# Patient Record
Sex: Female | Born: 2000 | Race: Black or African American | Hispanic: No | Marital: Single | Smoking: Never smoker
Health system: Southern US, Community
[De-identification: ages and names within clinical notes are randomized; demographics above are authoritative.]

## PROBLEM LIST (undated history)

## (undated) DIAGNOSIS — N390 Urinary tract infection, site not specified: Secondary | ICD-10-CM

## (undated) DIAGNOSIS — N83202 Unspecified ovarian cyst, left side: Secondary | ICD-10-CM

## (undated) DIAGNOSIS — N946 Dysmenorrhea, unspecified: Secondary | ICD-10-CM

## (undated) DIAGNOSIS — N920 Excessive and frequent menstruation with regular cycle: Secondary | ICD-10-CM

## (undated) DIAGNOSIS — D649 Anemia, unspecified: Secondary | ICD-10-CM

## (undated) HISTORY — DX: Unspecified ovarian cyst, left side: N83.202

## (undated) HISTORY — DX: Anemia, unspecified: D64.9

## (undated) HISTORY — DX: Urinary tract infection, site not specified: N39.0

## (undated) HISTORY — DX: Dysmenorrhea, unspecified: N94.6

## (undated) HISTORY — DX: Excessive and frequent menstruation with regular cycle: N92.0

---
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
## 2019-04-29 ENCOUNTER — Telehealth: Payer: Self-pay

## 2019-04-29 NOTE — Telephone Encounter (Signed)
This is ok if covid 19 screen negative for both

## 2019-04-29 NOTE — Telephone Encounter (Signed)
Copied from [place] [id number]. Topic: General - Other >> Apr 29, 2019  9:18 AM Oneta Rack wrote: Reason for [id number] scheduled NPA with Dr. Aundra Dubin for 05/29/2019, mother would like to accompany daughter at first visit, please advise

## 2019-04-29 NOTE — Telephone Encounter (Signed)
[place] if mother comes in?

## 2019-04-29 NOTE — Telephone Encounter (Signed)
Patients mother has been notified.

## 2019-05-29 ENCOUNTER — Encounter: Payer: Self-pay | Admitting: Internal Medicine

## 2019-05-29 ENCOUNTER — Ambulatory Visit (INDEPENDENT_AMBULATORY_CARE_PROVIDER_SITE_OTHER): Payer: PPO | Admitting: Internal Medicine

## 2019-05-29 ENCOUNTER — Encounter

## 2019-05-29 VITALS — Ht 64.0 in | Wt 135.0 lb

## 2019-05-29 DIAGNOSIS — D649 Anemia, unspecified: Secondary | ICD-10-CM | POA: Diagnosis not present

## 2019-05-29 DIAGNOSIS — Z0000 Encounter for general adult medical examination without abnormal findings: Secondary | ICD-10-CM

## 2019-05-29 DIAGNOSIS — E559 Vitamin D deficiency, unspecified: Secondary | ICD-10-CM

## 2019-05-29 DIAGNOSIS — N924 Excessive bleeding in the premenopausal period: Secondary | ICD-10-CM | POA: Diagnosis not present

## 2019-05-29 DIAGNOSIS — N946 Dysmenorrhea, unspecified: Secondary | ICD-10-CM

## 2019-05-29 DIAGNOSIS — N921 Excessive and frequent menstruation with irregular cycle: Secondary | ICD-10-CM | POA: Insufficient documentation

## 2019-05-29 DIAGNOSIS — Z1159 Encounter for screening for other viral diseases: Secondary | ICD-10-CM

## 2019-05-29 DIAGNOSIS — Z1389 Encounter for screening for other disorder: Secondary | ICD-10-CM

## 2019-05-29 DIAGNOSIS — Z1329 Encounter for screening for other suspected endocrine disorder: Secondary | ICD-10-CM

## 2019-05-29 DIAGNOSIS — Z0184 Encounter for antibody response examination: Secondary | ICD-10-CM

## 2019-05-29 NOTE — Progress Notes (Signed)
Virtual Visit via Video Note  I connected with Shirley Green  on 05/29/19 at  3:00 PM EST by a video enabled telemedicine application and verified that I am speaking with the correct person using two identifiers.  Location patient: home Location provider:work or home office Persons participating in the virtual visit: patient, provider, pts mom Shirley Green  I discussed the limitations of evaluation and management by telemedicine and the availability of in person appointments. The patient expressed understanding and agreed to proceed.   HPI: 1. C/o heavy cycles though regular with severe cramps, chronically was given ocp ortho tri cyclen lo vs tri lo marzia 11/2018 by [place] and tried progesterone cream w/o relief. Cycles start heavy stop and start with light to heavy to light flow. Tried Ibuprofen/Advil/Aleve w/o relief and a heating pad mom sees Fraser Din at [facility] will refer there    ROS: See pertinent positives and negatives per HPI. General: wt stable HEENT: no sore throat  CV: no chest pain  Lungs: no sob GI: ab pain with menses with cramping  GU: severe heavy menses  MSK: no jt pain  Skin: no issues  Neuro: no h/a  Psych: no mood issues  Past Medical History:  Diagnosis Date  . Anemia   . Dysmenorrhea   . Heavy menses     Past Surgical History:  Procedure Laterality Date  . TONSILLECTOMY Bilateral 2007    Family History  Problem Relation Age of Onset  . Heart Problems Mother        born TOF with pulmonary valve replaced 09/2016   . Thyroid cancer Mother   . Other Mother        breast implants 07/2001;small bowel obstruction; DDD C5/6 psinal fusion; C3/6/7 DDD; breast mass with right lumpectomy 04/2016  . Neuropathy Mother        pain clinic on topamax   . Migraines Mother   . Cholecystitis Mother        GB removed  . Hernia Mother        hernia repair Dr. Bary Castilla   . Colitis Mother        [facility] Dr. Candace Cruise  . Rheum arthritis Mother        ?  Berenice Primas'  disease Mother        RAI x 2 04/2010& 01/2011 dx Dr. Cruzita Lederer  . Allergies Brother   . Hyperlipidemia Maternal Grandmother   . Hypertension Maternal Grandmother   . Hyperlipidemia Maternal Grandfather   . Hypertension Maternal Grandfather   . Hyperlipidemia Paternal Grandmother   . Hypertension Paternal Grandmother   . Hyperlipidemia Paternal Grandfather   . Hypertension Paternal Grandfather   . Heart disease Paternal Grandfather   . Prostate cancer Other        m GF  . Lung cancer Other        p GGF  . Diabetes Other        pGGM  . Cancer Other        mGGF  . Stroke Other        mGGF  . Pancreatic cancer Other        mGGM     SOCIAL HX:  DPR Shaylene Paganelli [phone] mom Likes to travel has been on cruises, been to [place], [place], [place]. 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  TRI-LO-MARZIA 0.18/0.215/0.25  MG-25 MCG tab, Take 1 tablet by mouth daily., Disp: , Rfl:   EXAM:  VITALS per patient if applicable:  GENERAL: alert, oriented, appears well and in no acute distress  HEENT: atraumatic, conjunttiva clear, no obvious abnormalities on inspection of external nose and ears  NECK: normal movements of the head and neck  LUNGS: on inspection no signs of respiratory distress, breathing rate appears normal, no obvious gross SOB, gasping or wheezing  CV: no obvious cyanosis  MS: moves all visible extremities without noticeable abnormality  PSYCH/NEURO: pleasant and cooperative, no obvious depression or anxiety, speech and thought processing grossly intact  ASSESSMENT AND PLAN:  Discussed the following assessment and plan:  Dysmenorrhea - Plan: obgyn  Menorrhagia, premenopausal - Plan: obgyn This needs to be managed by ob/gyn as I do not manage this and ocp or other options need to be discussed with ob/gyn Fraser Din   Anemia,  unspecified type - Plan: CBC with Differential/Platelet, Iron, TIBC and Ferritin Panel She is supposed to be on ferrous sulfate by peds 325 with breakfast hbg 10.3 in 11/2018   HM Flu shot pt wants to get with labs nonfasting  Tdap due 01/2022  See vaccines in immunizations will pull over  Check MMR and hep B status Negative TB 11/2018 rec healthy diet and exercise and resp. Choices.   -we discussed possible serious and likely etiologies, options for evaluation and workup, limitations of telemedicine visit vs in person visit, treatment, treatment risks and precautions. Pt prefers to treat via telemedicine empirically rather then risking or undertaking an in person visit at this moment. Patient agrees to seek prompt in person care if worsening, new symptoms arise, or if is not improving with treatment.   I discussed the assessment and treatment plan with the patient. The patient was provided an opportunity to ask questions and all were answered. The patient agreed with the plan and demonstrated an understanding of the instructions.   The patient was advised to call back or seek an in-person evaluation if the symptoms worsen or if the condition fails to improve as anticipated.  Time spent 30 minutes  Delorise Jackson, MD

## 2019-06-02 ENCOUNTER — Encounter: Payer: Self-pay | Admitting: Internal Medicine

## 2019-06-02 NOTE — Patient Instructions (Signed)
Menorrhagia ° °Menorrhagia is a condition in which menstrual periods are heavy or last longer than normal. With menorrhagia, most periods a woman has may cause enough blood loss and cramping that she becomes unable to take part in her usual activities. °What are the causes? °Common causes of this condition include: °· Noncancerous growths in the uterus (polyps or fibroids). °· An imbalance of the estrogen and progesterone hormones. °· One of the ovaries not releasing an egg during one or more months. °· A problem with the thyroid gland (hypothyroid). °· Side effects of having an intrauterine device (IUD). °· Side effects of some medicines, such as anti-inflammatory medicines or blood thinners. °· A bleeding disorder that stops the blood from clotting normally. °In some cases, the cause of this condition is not known. °What are the signs or symptoms? °Symptoms of this condition include: °· Routinely having to change your pad or tampon every 1-2 hours because it is completely soaked. °· Needing to use pads and tampons at the same time because of heavy bleeding. °· Needing to wake up to change your pads or tampons during the night. °· Passing blood clots larger than 1 inch (2.5 cm) in size. °· Having bleeding that lasts for more than 7 days. °· Having symptoms of low iron levels (anemia), such as tiredness, fatigue, or shortness of breath. °How is this diagnosed? °This condition may be diagnosed based on: °· A physical exam. °· Your symptoms and menstrual history. °· Tests, such as: °? Blood tests to check if you are pregnant or have hormonal changes, a bleeding or thyroid disorder, anemia, or other problems. °? Pap test to check for cancerous changes, infections, or inflammation. °? Endometrial biopsy. This test involves removing a tissue sample from the lining of the uterus (endometrium) to be examined under a microscope. °? Pelvic ultrasound. This test uses sound waves to create images of your uterus, ovaries, and  vagina. The images can show if you have fibroids or other growths. °? Hysteroscopy. For this test, a small telescope is used to look inside your uterus. °How is this treated? °Treatment may not be needed for this condition. If it is needed, the best treatment for you will depend on: °· Whether you need to prevent pregnancy. °· Your desire to have children in the future. °· The cause and severity of your bleeding. °· Your personal preference. °Medicines are the first step in treatment. You may be treated with: °· Hormonal birth control methods. These treatments reduce bleeding during your menstrual period. They include: °? Birth control pills. °? Skin patch. °? Vaginal ring. °? Shots (injections) that you get every 3 months. °? Hormonal IUD (intrauterine device). °? Implants that go under the skin. °· Medicines that thicken blood and slow bleeding. °· Medicines that reduce swelling, such as ibuprofen. °· Medicines that contain an artificial (synthetic) hormone called progestin. °· Medicines that make the ovaries stop working for a short time. °· Iron supplements to treat anemia. °If medicines do not work, surgery may be done. Surgical options may include: °· Dilation and curettage (D&C). In this procedure, your health care provider opens (dilates) your cervix and then scrapes or suctions tissue from the endometrium to reduce menstrual bleeding. °· Operative hysteroscopy. In this procedure, a small tube with a light on the end (hysteroscope) is used to view your uterus and help remove polyps that may be causing heavy periods. °· Endometrial ablation. This is when various techniques are used to permanently destroy your entire endometrium.   After endometrial ablation, most women have little or no menstrual flow. This procedure reduces your ability to become pregnant.  Endometrial resection. In this procedure, an electrosurgical wire loop is used to remove the endometrium. This procedure reduces your ability to become  pregnant.  Hysterectomy. This is surgical removal of the uterus. This is a permanent procedure that stops menstrual periods. Pregnancy is not possible after a hysterectomy. Follow these instructions at home: Medicines  Take over-the-counter and prescription medicines exactly as told by your health care provider. This includes iron pills.  Do not change or switch medicines without asking your health care provider.  Do not take aspirin or medicines that contain aspirin 1 week before or during your menstrual period. Aspirin may make bleeding worse. General instructions  If you need to change your sanitary pad or tampon more than once every 2 hours, limit your activity until the bleeding stops.  Iron pills can cause constipation. To prevent or treat constipation while you are taking prescription iron supplements, your health care provider may recommend that you: ? Drink enough fluid to keep your urine clear or pale yellow. ? Take over-the-counter or prescription medicines. ? Eat foods that are high in fiber, such as fresh fruits and vegetables, whole grains, and beans. ? Limit foods that are high in fat and processed sugars, such as fried and sweet foods.  Eat well-balanced meals, including foods that are high in iron. Foods that have a lot of iron include leafy green vegetables, meat, liver, eggs, and whole grain breads and cereals.  Do not try to lose weight until the abnormal bleeding has stopped and your blood iron level is back to normal. If you need to lose weight, work with your health care provider to lose weight safely.  Keep all follow-up visits as told by your health care provider. This is important. Contact a health care provider if:  You soak through a pad or tampon every 1 or 2 hours, and this happens every time you have a period.  You need to use pads and tampons at the same time because you are bleeding so much.  You have nausea, vomiting, diarrhea, or other problems  related to medicines you are taking. Get help right away if:  You soak through more than a pad or tampon in 1 hour.  You pass clots bigger than 1 inch (2.5 cm) wide.  You feel short of breath.  You feel like your heart is beating too fast.  You feel dizzy or faint.  You feel very weak or tired. Summary  Menorrhagia is a condition in which menstrual periods are heavy or last longer than normal.  Treatment will depend on the cause of the condition and may include medicines or procedures.  Take over-the-counter and prescription medicines exactly as told by your health care provider. This includes iron pills.  Get help right away if you have heavy bleeding that soaks through more than a pad or tampon in 1 hour, you are passing large clots, or you feel dizzy, faint or short of breath. This information is not intended to replace advice given to you by your health care provider. Make sure you discuss any questions you have with your health care provider. Document Released: 06/12/2005 Document Revised: 09/19/2017 Document Reviewed: 06/05/2016 Elsevier Patient Education  2020 [employer]  Dysmenorrhea  Dysmenorrhea refers to cramps caused by the muscles of the uterus tightening (contracting) during a menstrual period. Dysmenorrhea may be mild, or it may be severe enough  to interfere with everyday activities for a few days each month. Primary dysmenorrhea is menstrual cramps that last a couple of days when you start having menstrual periods or soon after. This often begins after a teenager starts having her period. As a woman gets older or has a baby, the cramps will usually lessen or disappear. Secondary dysmenorrhea begins later in life and is caused by a disorder in the reproductive system. It lasts longer, and it may cause more pain than primary dysmenorrhea. The pain may start before the period and last a few days after the period. What are the causes? Dysmenorrhea is usually caused by  an underlying problem, such as:  The tissue that lines the uterus (endometrium) growing outside of the uterus in other areas of the body (endometriosis).  Endometrial tissue growing into the muscular walls of the uterus (adenomyosis).  Blood vessels in the pelvis becoming filled with blood just before the menstrual period (pelvic congestive syndrome).  Overgrowth of cells (polyps) in the endometrium or the lower part of the uterus (cervix).  The uterus dropping down into the vagina (prolapse) due to stretched or weak muscles.  Bladder problems, such as infection or inflammation.  Intestinal problems, such as a tumor or irritable bowel syndrome.  Cancer of the reproductive organs or bladder.  A severely tipped uterus.  A cervix that is closed or has a very small opening.  Noncancerous (benign) tumors of the uterus (fibroids).  Pelvic inflammatory disease (PID).  Pelvic scarring (adhesions) from a previous surgery.  An ovarian cyst.  An IUD (intrauterine device). What increases the risk? You are more likely to develop this condition if:  You are younger than age 19.  You started puberty early.  You have irregular or heavy bleeding.  You have never given birth.  You have a family history of dysmenorrhea.  You smoke. What are the signs or symptoms? Symptoms of this condition include:  Cramping, throbbing pain, or a feeling of fullness in the lower abdomen.  Lower back pain.  Periods lasting for longer than 7 days.  Headaches.  Bloating.  Fatigue.  Nausea or vomiting.  Diarrhea.  Sweating or dizziness.  Loose stools. How is this diagnosed? This condition may be diagnosed based on:  Your symptoms.  Your medical history.  A physical exam.  Blood tests.  A Pap test. This is a test in which cells from the cervix are tested for signs of cancer or infection.  A pregnancy test.  Imaging tests, such as: ? Ultrasound. ? A procedure to remove and  examine a sample of endometrial tissue (dilation and curettage, D&C). ? A procedure to visually examine the inside of:  The uterus (hysteroscopy).  The abdomen or pelvis (laparoscopy).  The bladder (cystoscopy).  The intestine (colonoscopy).  The stomach (gastroscopy). ? X-rays. ? CT scan. ? MRI. How is this treated? Treatment depends on the cause of the dysmenorrhea. Treatment may include:  Pain medicine prescribed by your health care provider.  Birth control pills that contain the hormone progesterone.  An IUD that contains the hormone progesterone.  Medicines to control bleeding.  Hormone replacement therapy.  NSAIDs. These may help to stop the production of hormones that cause cramps.  Antidepressant medicines.  Surgery to remove adhesions, endometriosis, ovarian cysts, fibroids, or the entire uterus (hysterectomy).  Injections of progesterone to stop the menstrual period.  A procedure to destroy the endometrium (endometrial ablation).  A procedure to cut the nerves in the bottom of the spine (sacrum)  that go to the reproductive organs (presacral neurectomy).  A procedure to apply an electric current to nerves in the sacrum (sacral nerve stimulation).  Exercise and physical therapy.  Meditation and yoga therapy.  Acupuncture. Work with your health care provider to determine what treatment or combination of treatments is best for you. Follow these instructions at home: Relieving pain and cramping  Apply heat to your lower back or abdomen when you experience pain or cramps. Use the heat source that your health care provider recommends, such as a moist heat pack or a heating pad. ? Place a towel between your skin and the heat source. ? Leave the heat on for 20-30 minutes. ? Remove the heat if your skin turns bright red. This is especially important if you are unable to feel pain, heat, or cold. You may have a greater risk of getting burned. ? Do not sleep with  a heating pad on.  Do aerobic exercises, such as walking, swimming, or biking. This can help to relieve cramps.  Massage your lower back or abdomen to help relieve pain. General instructions  Take over-the-counter and prescription medicines only as told by your health care provider.  Do not drive or use heavy machinery while taking prescription pain medicine.  Avoid alcohol and caffeine during and right before your menstrual period. These can make cramps worse.  Do not use any products that contain nicotine or tobacco, such as cigarettes and e-cigarettes. If you need help quitting, ask your health care provider.  Keep all follow-up visits as told by your health care provider. This is important. Contact a health care provider if:  You have pain that gets worse or does not get better with medicine.  You have pain with sex.  You develop nausea or vomiting with your period that is not controlled with medicine. Get help right away if:  You faint. Summary  Dysmenorrhea refers to cramps caused by the muscles of the uterus tightening (contracting) during a menstrual period.  Dysmenorrhea may be mild, or it may be severe enough to interfere with everyday activities for a few days each month.  Treatment depends on the cause of the dysmenorrhea.  Work with your health care provider to determine what treatment or combination of treatments is best for you. This information is not intended to replace advice given to you by your health care provider. Make sure you discuss any questions you have with your health care provider. Document Released: 06/12/2005 Document Revised: 05/25/2017 Document Reviewed: 07/15/2016 Elsevier Patient Education  2020 [employer].

## 2019-06-02 NOTE — Progress Notes (Signed)
I have abstracted immunizations & mailed AVS.

## 2019-06-06 ENCOUNTER — Other Ambulatory Visit: Payer: Self-pay

## 2019-06-10 ENCOUNTER — Ambulatory Visit (INDEPENDENT_AMBULATORY_CARE_PROVIDER_SITE_OTHER): Payer: PPO | Admitting: Obstetrics and Gynecology

## 2019-06-10 ENCOUNTER — Other Ambulatory Visit: Payer: Self-pay

## 2019-06-10 ENCOUNTER — Encounter: Payer: Self-pay | Admitting: Obstetrics and Gynecology

## 2019-06-10 VITALS — BP 110/80 | Ht 64.0 in | Wt 132.0 lb

## 2019-06-10 DIAGNOSIS — N921 Excessive and frequent menstruation with irregular cycle: Secondary | ICD-10-CM | POA: Diagnosis not present

## 2019-06-10 DIAGNOSIS — D649 Anemia, unspecified: Secondary | ICD-10-CM | POA: Diagnosis not present

## 2019-06-10 DIAGNOSIS — Z3041 Encounter for surveillance of contraceptive pills: Secondary | ICD-10-CM

## 2019-06-10 MED ORDER — MICROGESTIN 24 FE 1-20 MG-MCG PO TABS: 1.0000 | ORAL_TABLET | Freq: Every day | ORAL | 3 refills | Status: DC

## 2019-06-10 NOTE — Progress Notes (Signed)
McLean-Scocuzza, Pasty Spillers, MD   Chief Complaint  Patient presents with  . Dysmenorrhea    stomach pain with periods since started having cycles 2015, regular flow currently OCP's    HPI:      Ms. Shirley Green is a 18 y.o. No obstetric history on file. who LMP was Patient's last menstrual period was 05/21/2019 (exact date)., presents today for NP eval of dysmen, referred by PCP. Menarche age 65. Menses Q6-8 wks, lasting 6-7 days, mod flow, with clots, no BTB. Had dysmen, not improved with NSAIDs, tylenol. Would sometimes miss school/activities due to pain. Started on OTC Lo 6/20 with pediatrician. Having monthly menses, lasting 5 days, mod flow (no real change), no BTB, and still with severe dsymen. NSAIDs still don't help sx. Has also been having non-migraine headaches since starting OCPs. No hx of HTN, DVTs, migraines with aura. Currently being treated for anemia as well. Was taking Fe supp but stopped. Has labs tomorrow. She has never been sex active.  No FH endometriosis.   Patient Active Problem List   Diagnosis Date Noted  . Menometrorrhagia 05/29/2019  . Dysmenorrhea 05/29/2019  . Anemia 05/29/2019    Past Surgical History:  Procedure Laterality Date  . TONSILLECTOMY Bilateral 2007    Family History  Problem Relation Age of Onset  . Heart Problems Mother        born TOF with pulmonary valve replaced 09/2016   . Thyroid cancer Mother   . Other Mother        breast implants 07/2001;small bowel obstruction; DDD C5/6 psinal fusion; C3/6/7 DDD; breast mass with right lumpectomy 04/2016  . Neuropathy Mother        pain clinic on topamax   . Migraines Mother   . Cholecystitis Mother        GB removed  . Hernia Mother        hernia repair Dr. Lemar Livings   . Colitis Mother        [facility] GI Dr. Bluford Kaufmann  . Rheum arthritis Mother        ?  Luiz Blare' disease Mother        RAI x 2 04/2010& 01/2011 dx Dr. Elvera Lennox  . Allergies Brother   . Hyperlipidemia Maternal Grandmother   .  Hypertension Maternal Grandmother   . Hyperlipidemia Maternal Grandfather   . Hypertension Maternal Grandfather   . Hyperlipidemia Paternal Grandmother   . Hypertension Paternal Grandmother   . Hyperlipidemia Paternal Grandfather   . Hypertension Paternal Grandfather   . Heart disease Paternal Grandfather   . Prostate cancer Other        m GF  . Lung cancer Other        p GGF  . Diabetes Other        pGGM  . Cancer Other        mGGF  . Stroke Other        mGGF  . Pancreatic cancer Other        mGGM     Social History   Socioeconomic History  . Marital status: Single    Spouse name: Not on file  . Number of children: Not on file  . Years of education: Not on file  . Highest education level: Not on file  Occupational History  . Not on file  Tobacco Use  . Smoking status: Never Smoker  . Smokeless tobacco: Never Used  Substance and Sexual Activity  . Alcohol use: Never  . Drug  use: Never  . Sexual activity: Never    Birth control/protection: Pill    Comment: oral sex only   Other Topics Concern  . Not on file  Social History Narrative   DPR Lavonia Danaorriah Murnane [phone] mom   Likes to travel has been on cruises, been to [place]    [employer] 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 Difficulty of Paying Living Expenses: Not on file  Food Insecurity:   . Worried About Programme researcher, broadcasting/film/videounning Out of Food in the Last Year: Not on file  . Ran Out of Food in the Last Year: Not on file  Transportation Needs:   . Lack of Transportation (Medical): Not on file  . Lack of Transportation (Non-Medical): Not on file  Physical Activity:   . Days of Exercise per Week: Not on file  . Minutes of Exercise per Session: Not on file  Stress:   . Feeling of Stress : Not on file  Social Connections:   . Frequency of Communication with Friends  and Family: Not on file  . Frequency of Social Gatherings with Friends and Family: Not on file  . Attends Religious Services: Not on file  . Active Member of Clubs or Organizations: Not on file  . Attends BankerClub or Organization Meetings: Not on file  . Marital Status: Not on file  Intimate Partner Violence:   . Fear of Current or Ex-Partner: Not on file  . Emotionally Abused: Not on file  . Physically Abused: Not on file  . Sexually Abused: Not on file    Outpatient Medications Prior to Visit  Medication Sig Dispense Refill  . TRI-LO-MARZIA 0.18/0.215/0.25 MG-25 MCG tab Take 1 tablet by mouth daily.     No facility-administered medications prior to visit.      ROS:  Review of Systems  Constitutional: Negative for fatigue, fever and unexpected weight change.  Respiratory: Negative for cough, shortness of breath and wheezing.   Cardiovascular: Negative for chest pain, palpitations and leg swelling.  Gastrointestinal: Negative for blood in stool, constipation, diarrhea, nausea and vomiting.  Endocrine: Negative for cold intolerance, heat intolerance and polyuria.  Genitourinary: Positive for menstrual problem. Negative for dyspareunia, dysuria, flank pain, frequency, genital sores, hematuria, pelvic pain, urgency, vaginal bleeding, vaginal discharge and vaginal pain.  Musculoskeletal: Negative for back pain, joint swelling and myalgias.  Skin: Negative for rash.  Neurological: Positive for headaches. Negative for dizziness, syncope, light-headedness and numbness.  Hematological: Negative for adenopathy.  Psychiatric/Behavioral: Negative for agitation, confusion, sleep disturbance and suicidal ideas. The patient is not nervous/anxious.    BREAST: No symptoms   OBJECTIVE:   Vitals:  BP 110/80   Ht 5\' 4"  (1.626 m)   Wt 132 lb (59.9 kg)   LMP 05/21/2019 (Exact Date)   BMI 22.66 kg/m   Physical Exam Vitals reviewed.  Constitutional:      Appearance: She is well-developed.   Pulmonary:     Effort: Pulmonary effort is normal.  Musculoskeletal:        General: Normal range of motion.     Cervical back: Normal range of motion.  Skin:    General: Skin is warm and dry.  Neurological:     General: No focal deficit present.     Mental Status: She is alert and oriented to person, place, and  time.     Cranial Nerves: No cranial nerve deficit.  Psychiatric:        Mood and Affect: Mood normal.        Behavior: Behavior normal.        Thought Content: Thought content normal.        Judgment: Judgment normal.      Assessment/Plan: Menometrorrhagia - Plan: Norethindrone Acetate-Ethinyl Estrad-FE (MICROGESTIN 24 FE) 1-20 MG-MCG(24) tablet; Discussed changing OCPs, depo, nexplanon. Pt prefers to stay on pills. Change to L[place]. Rx eRxd. F/u if sx persist. May need u/s if no sx change.   Encounter for surveillance of contraceptive pills - Plan: Norethindrone Acetate-Ethinyl Estrad-FE (MICROGESTIN 24 FE) 1-20 MG-MCG(24) tablet  Anemia--has lab f/u tomorrow.    Meds ordered this encounter  Medications  . Norethindrone Acetate-Ethinyl Estrad-FE (MICROGESTIN 24 FE) 1-20 MG-MCG(24) tablet    Sig: Take 1 tablet by mouth daily.    Dispense:  84 tablet    Refill:  3    Order Specific Question:   Supervising Provider    Answer:   Gae Dry [[id number]]     Return if symptoms worsen or fail to improve.  Tylee Newby B. Ramiel Forti, PA-C 06/10/2019 4:04 PM

## 2019-06-10 NOTE — Patient Instructions (Signed)
I value your feedback and entrusting us with your care. If you get a [facility] patient survey, I would appreciate you taking the time to let us know about your experience today. Thank you!  As of June 05, 2019, your lab results will be released to your MyChart immediately, before I even have a chance to see them. Please give me time to review them and contact you if there are any abnormalities. Thank you for your patience.  

## 2019-06-11 ENCOUNTER — Other Ambulatory Visit: Payer: Self-pay | Admitting: Internal Medicine

## 2019-06-11 ENCOUNTER — Other Ambulatory Visit: Payer: Self-pay

## 2019-06-11 ENCOUNTER — Encounter: Payer: Self-pay | Admitting: Internal Medicine

## 2019-06-11 ENCOUNTER — Ambulatory Visit (INDEPENDENT_AMBULATORY_CARE_PROVIDER_SITE_OTHER): Payer: PPO

## 2019-06-11 ENCOUNTER — Other Ambulatory Visit (INDEPENDENT_AMBULATORY_CARE_PROVIDER_SITE_OTHER): Payer: PPO

## 2019-06-11 DIAGNOSIS — Z1389 Encounter for screening for other disorder: Secondary | ICD-10-CM

## 2019-06-11 DIAGNOSIS — D649 Anemia, unspecified: Secondary | ICD-10-CM

## 2019-06-11 DIAGNOSIS — E559 Vitamin D deficiency, unspecified: Secondary | ICD-10-CM

## 2019-06-11 DIAGNOSIS — Z1159 Encounter for screening for other viral diseases: Secondary | ICD-10-CM

## 2019-06-11 DIAGNOSIS — Z23 Encounter for immunization: Secondary | ICD-10-CM

## 2019-06-11 DIAGNOSIS — Z0000 Encounter for general adult medical examination without abnormal findings: Secondary | ICD-10-CM | POA: Diagnosis not present

## 2019-06-11 DIAGNOSIS — Z1329 Encounter for screening for other suspected endocrine disorder: Secondary | ICD-10-CM | POA: Diagnosis not present

## 2019-06-11 DIAGNOSIS — Z0184 Encounter for antibody response examination: Secondary | ICD-10-CM

## 2019-06-11 LAB — COMPREHENSIVE METABOLIC PANEL
ALT: 9 U/L (ref 0–35)
AST: 12 U/L (ref 0–37)
Albumin: 4.5 g/dL (ref 3.5–5.2)
Alkaline Phosphatase: 36 U/L — ABNORMAL LOW (ref 47–119)
BUN: 11 mg/dL (ref 6–23)
CO2: 26 mEq/L (ref 19–32)
Calcium: 9.3 mg/dL (ref 8.4–10.5)
Chloride: 104 mEq/L (ref 96–112)
Creatinine, Ser: 0.69 mg/dL (ref 0.40–1.20)
GFR: 132.62 mL/min (ref >60.00)
Glucose, Bld: 79 mg/dL (ref 70–99)
Potassium: 3.8 mEq/L (ref 3.5–5.1)
Sodium: 138 mEq/L (ref 135–145)
Total Bilirubin: 0.4 mg/dL (ref 0.3–1.2)
Total Protein: 7.2 g/dL (ref 6.0–8.3)

## 2019-06-11 LAB — URINALYSIS, ROUTINE W REFLEX MICROSCOPIC
Bilirubin Urine: NEGATIVE
Hgb urine dipstick: NEGATIVE
Ketones, ur: NEGATIVE
Leukocytes,Ua: NEGATIVE
Nitrite: NEGATIVE
RBC / HPF: NONE SEEN (ref 0–?)
Specific Gravity, Urine: >=1.03 — AB (ref 1.000–1.030)
Total Protein, Urine: NEGATIVE
Urine Glucose: NEGATIVE
Urobilinogen, UA: 0.2 (ref 0.0–1.0)
WBC, UA: NONE SEEN (ref 0–?)
pH: 6 (ref 5.0–8.0)

## 2019-06-11 LAB — CBC WITH DIFFERENTIAL/PLATELET
Basophils Absolute: 0 10*3/uL (ref 0.0–0.1)
Basophils Relative: 0.6 % (ref 0.0–3.0)
Eosinophils Absolute: 0.2 10*3/uL (ref 0.0–0.7)
Eosinophils Relative: 3.5 % (ref 0.0–5.0)
HCT: 34.3 % — ABNORMAL LOW (ref 36.0–49.0)
Hemoglobin: 11.4 g/dL — ABNORMAL LOW (ref 12.0–16.0)
Lymphocytes Relative: 31.6 % (ref 24.0–48.0)
Lymphs Abs: 2.2 10*3/uL (ref 0.7–4.0)
MCHC: 33.3 g/dL (ref 31.0–37.0)
MCV: 90.7 fl (ref 78.0–98.0)
Monocytes Absolute: 0.3 10*3/uL (ref 0.1–1.0)
Monocytes Relative: 5.1 % (ref 3.0–12.0)
Neutro Abs: 4 10*3/uL (ref 1.4–7.7)
Neutrophils Relative %: 59.2 % (ref 43.0–71.0)
Platelets: 390 10*3/uL (ref 150.0–575.0)
RBC: 3.78 Mil/uL — ABNORMAL LOW (ref 3.80–5.70)
RDW: 12.4 % (ref 11.4–15.5)
WBC: 6.8 10*3/uL (ref 4.5–13.5)

## 2019-06-11 LAB — TSH: TSH: 1.89 u[IU]/mL (ref 0.40–5.00)

## 2019-06-11 LAB — T4, FREE: Free T4: 0.96 ng/dL (ref 0.60–1.60)

## 2019-06-11 LAB — VITAMIN D 25 HYDROXY (VIT D DEFICIENCY, FRACTURES): VITD: 12.32 ng/mL — ABNORMAL LOW (ref 30.00–100.00)

## 2019-06-11 MED ORDER — CHOLECALCIFEROL 1.25 MG (50000 UT) PO CAPS: 50000.0000 [IU] | ORAL_CAPSULE | ORAL | 1 refills | Status: DC

## 2019-06-12 LAB — IRON,TIBC AND FERRITIN PANEL
%SAT: 21 % (calc) (ref 15–45)
Ferritin: 64 ng/mL (ref 16–154)
Iron: 95 ug/dL (ref 27–164)
TIBC: 456 mcg/dL (calc) — ABNORMAL HIGH (ref 271–448)

## 2019-06-12 LAB — MEASLES/MUMPS/RUBELLA IMMUNITY
Mumps IgG: 155 AU/mL
Rubella: 3.06 Index
Rubeola IgG: >300 AU/mL

## 2019-06-12 LAB — HEPATITIS B SURFACE ANTIBODY, QUANTITATIVE: Hep B S AB Quant (Post): <5 m[IU]/mL — ABNORMAL LOW (ref >=10)

## 2019-12-05 ENCOUNTER — Ambulatory Visit: Payer: PPO | Admitting: Internal Medicine

## 2019-12-11 ENCOUNTER — Ambulatory Visit: Payer: PPO | Admitting: Internal Medicine

## 2019-12-11 ENCOUNTER — Encounter: Payer: Self-pay | Admitting: Internal Medicine

## 2019-12-11 ENCOUNTER — Other Ambulatory Visit: Payer: Self-pay

## 2019-12-11 VITALS — BP 110/70 | HR 67 | Temp 97.9°F | Ht 64.02 in | Wt 126.6 lb

## 2019-12-11 DIAGNOSIS — Z1329 Encounter for screening for other suspected endocrine disorder: Secondary | ICD-10-CM

## 2019-12-11 DIAGNOSIS — Z0000 Encounter for general adult medical examination without abnormal findings: Secondary | ICD-10-CM | POA: Diagnosis not present

## 2019-12-11 DIAGNOSIS — E559 Vitamin D deficiency, unspecified: Secondary | ICD-10-CM | POA: Diagnosis not present

## 2019-12-11 DIAGNOSIS — N938 Other specified abnormal uterine and vaginal bleeding: Secondary | ICD-10-CM | POA: Diagnosis not present

## 2019-12-11 DIAGNOSIS — D509 Iron deficiency anemia, unspecified: Secondary | ICD-10-CM

## 2019-12-11 DIAGNOSIS — Z1389 Encounter for screening for other disorder: Secondary | ICD-10-CM

## 2019-12-11 NOTE — Progress Notes (Signed)
Chief Complaint  Patient presents with  . Follow-up   Annual  Pt still having bad cramping and heavy cyclines and microgestin caused to bleed x 2 weeks x the last 3 cycles so she did not take for June Micorgestin sent [facility] message [facility]. 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  HENT: Negative for hearing loss.   Eyes: Negative for blurred vision.  Respiratory: Negative for shortness of breath.   Cardiovascular: Negative for chest pain.  Gastrointestinal: Negative for abdominal pain.  Genitourinary:       +DUB and cramps  Musculoskeletal: Negative for falls.  Skin: Negative for rash.  Neurological: Negative for headaches.  Psychiatric/Behavioral: Negative for depression.   Past Medical History:  Diagnosis Date  . Anemia   . Dysmenorrhea   . Heavy menses    Past Surgical History:  Procedure Laterality Date  . TONSILLECTOMY Bilateral 2007   Family History  Problem Relation Age of Onset  . Heart Problems Mother        born TOF with pulmonary valve replaced 09/2016   . Thyroid cancer Mother   . Other Mother        breast implants 07/2001;small bowel obstruction; DDD C5/6 psinal fusion; C3/6/7 DDD; breast mass with right lumpectomy 04/2016  . Neuropathy Mother        pain clinic on topamax   . Migraines Mother   . Cholecystitis Mother        GB removed  . Hernia Mother        hernia repair Dr. Bary Castilla   . Colitis Mother        [facility] GI Dr. Candace Cruise  . Rheum arthritis Mother        ?  Berenice Primas' disease Mother        RAI x 2 04/2010& 01/2011 dx Dr. Cruzita Lederer  . Allergies Brother   . Hyperlipidemia Maternal Grandmother   . Hypertension Maternal Grandmother   . Hyperlipidemia Maternal Grandfather   . Hypertension Maternal  Grandfather   . Hyperlipidemia Paternal Grandmother   . Hypertension Paternal Grandmother   . Hyperlipidemia Paternal Grandfather   . Hypertension Paternal Grandfather   . Heart disease Paternal Grandfather   . Prostate cancer Other        m GF  . Lung cancer Other        p GGF  . Diabetes Other        pGGM  . Cancer Other        mGGF  . Stroke Other        mGGF  . Pancreatic cancer Other        mGGM    Social History   Socioeconomic History  . Marital status: Single    Spouse name: Not on file  . Number of children: Not on file  . Years of education: Not on file  . Highest education level: Not on file  Occupational History  . Not on file  Tobacco Use  . Smoking status: Never Smoker  . Smokeless tobacco: Never Used  Vaping Use  . Vaping Use: Never used  Substance and Sexual Activity  . Alcohol use: Never  . Drug  use: Never  . Sexual activity: Never    Birth control/protection: Pill    Comment: oral sex only   Other Topics Concern  . Not on file  Social History Narrative   DPR Glenetta Kiger [phone] mom   Likes to travel has been on cruises, been to [place], [place], [place]. 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 Difficulty of Paying Living Expenses:   Food Insecurity:   . Worried About Charity fundraiser in the Last Year:   . Arboriculturist in the Last Year:   Transportation Needs:   . Film/video editor (Medical):   Marland Kitchen Lack of Transportation (Non-Medical):   Physical Activity:   . Days of Exercise per Week:   . Minutes of Exercise per Session:   Stress:   . Feeling of Stress :   Social Connections:   . Frequency of Communication with Friends and Family:   . Frequency of Social Gatherings with Friends and Family:   . Attends Religious  Services:   . Active Member of Clubs or Organizations:   . Attends Archivist Meetings:   Marland Kitchen Marital Status:   Intimate Partner Violence:   . Fear of Current or Ex-Partner:   . Emotionally Abused:   Marland Kitchen Physically Abused:   . Sexually Abused:    Current Meds  Medication Sig  . Norethindrone Acetate-Ethinyl Estrad-FE (MICROGESTIN 24 FE) 1-20 MG-MCG(24) tablet Take 1 tablet by mouth daily.   No Known Allergies No results found for this or any previous visit (from the past 2160 hour(s)). Objective  Body mass index is 21.72 kg/m. Wt Readings from Last 3 Encounters:  12/11/19 126 lb 9.6 oz (57.4 kg) (48 %, Z= -0.04)*  06/10/19 132 lb (59.9 kg) (60 %, Z= 0.26)*  05/29/19 135 lb (61.2 kg) (65 %, Z= 0.39)*   * Growth percentiles are based on CDC (Girls, 2-20 Years) data.   Temp Readings from Last 3 Encounters:  12/11/19 97.9 F (36.6 C) (Temporal)   BP Readings from Last 3 Encounters:  12/11/19 110/70  06/10/19 110/80   Pulse Readings from Last 3 Encounters:  12/11/19 67    Physical Exam Vitals and nursing note reviewed.  Constitutional:      Appearance: Normal appearance. She is well-developed and well-groomed.  HENT:     Head: Normocephalic and atraumatic.  Eyes:     Conjunctiva/sclera: Conjunctivae normal.     Pupils: Pupils are equal, round, and reactive to light.  Cardiovascular:     Rate and Rhythm: Normal rate and regular rhythm.     Heart sounds: Normal heart sounds. No murmur heard.   Pulmonary:     Effort: Pulmonary effort is normal.     Breath sounds: Normal breath sounds.  Skin:    General: Skin is warm and dry.  Neurological:     General: No focal deficit present.     Mental Status: She is alert and oriented to person, place, and time. Mental status is at baseline.     Gait: Gait normal.  Psychiatric:        Attention and Perception: Attention and perception normal.        Mood and Affect: Mood and affect  normal.        Speech: Speech normal.         Behavior: Behavior normal. Behavior is cooperative.        Thought Content: Thought content normal.        Cognition and Memory: Cognition and memory normal.        Judgment: Judgment normal.     Assessment  Plan  Annual physical exam Fasting labs 06/10/20 or after Flu shot utd  Tdap due 01/2022  MMR immune  covid shot 2/2 moderna  Consider hep B new x 2 doses pt to disc with mom Negative TB 11/2018 rec healthy diet and exercise and resp. Choices rec mvt daily with iron   Vitamin D deficiency rec D3 4000 IU qd   DUB (dysfunctional uterine bleeding)  F/u [facility] alicia copeland sent message for pt   Provider: Dr. Olivia Mackie McLean-Scocuzza-Internal Medicine

## 2019-12-11 NOTE — Patient Instructions (Addendum)
Multivitamin daily with iron  Vitamin D3 4000 IU daily  If pregnant max 2000 IU daily   Debrox ear wax drops 5-10 drops x 5-10 minutes for 1 week 1x per month   Consider Hep B vaccine heplisav-B x 2 doses 1 month apart on nurse visit   Carbamide Peroxide ear solution What is this medicine? CARBAMIDE PEROXIDE (CAR bah mide per OX ide) is used to soften and help remove ear wax. This medicine may be used for other purposes; ask your health care provider or pharmacist if you have questions. COMMON BRAND NAME(S): Auro Ear, Auro Earache Relief, Debrox, Ear Drops, Ear Wax Removal, Ear Wax Remover, Earwax Treatment, Murine, Thera-Ear What should I tell my health care provider before I take this medicine? They need to know if you have any of these conditions:  dizziness  ear discharge  ear pain, irritation or rash  infection  perforated eardrum (hole in eardrum)  an unusual or allergic reaction to carbamide peroxide, glycerin, hydrogen peroxide, other medicines, foods, dyes, or preservatives  pregnant or trying to get pregnant  breast-feeding How should I use this medicine? This medicine is only for use in the outer ear canal. Follow the directions carefully. Wash hands before and after use. The solution may be warmed by holding the bottle in the hand for 1 to 2 minutes. Lie with the affected ear facing upward. Place the proper number of drops into the ear canal. After the drops are instilled, remain lying with the affected ear upward for 5 minutes to help the drops stay in the ear canal. A cotton ball may be gently inserted at the ear opening for no longer than 5 to 10 minutes to ensure retention. Repeat, if necessary, for the opposite ear. Do not touch the tip of the dropper to the ear, fingertips, or other surface. Do not rinse the dropper after use. Keep container tightly closed. Talk to your pediatrician regarding the use of this medicine in children. While this drug may be used in  children as young as 12 years for selected conditions, precautions do apply. Overdosage: If you think you have taken too much of this medicine contact a poison control center or emergency room at once. NOTE: This medicine is only for you. Do not share this medicine with others. What if I miss a dose? If you miss a dose, use it as soon as you can. If it is almost time for your next dose, use only that dose. Do not use double or extra doses. What may interact with this medicine? Interactions are not expected. Do not use any other ear products without asking your doctor or health care professional. This list may not describe all possible interactions. Give your health care provider a list of all the medicines, herbs, non-prescription drugs, or dietary supplements you use. Also tell them if you smoke, drink alcohol, or use illegal drugs. Some items may interact with your medicine. What should I watch for while using this medicine? This medicine is not for long-term use. Do not use for more than 4 days without checking with your health care professional. Contact your doctor or health care professional if your condition does not start to get better within a few days or if you notice burning, redness, itching or swelling. What side effects may I notice from receiving this medicine? Side effects that you should report to your doctor or health care professional as soon as possible:  allergic reactions like skin rash, itching or hives,  swelling of the face, lips, or tongue  burning, itching, and redness  worsening ear pain  rash Side effects that usually do not require medical attention (report to your doctor or health care professional if they continue or are bothersome):  abnormal sensation while putting the drops in the ear  temporary reduction in hearing (but not complete loss of hearing) This list may not describe all possible side effects. Call your doctor for medical advice about side effects.  You may report side effects to FDA at 1-800-FDA-1088. Where should I keep my medicine? Keep out of the reach of children. Store at room temperature between 15 and 30 degrees C (59 and 86 degrees F) in a tight, light-resistant container. Keep bottle away from excessive heat and direct sunlight. Throw away any unused medicine after the expiration date. NOTE: This sheet is a summary. It may not cover all possible information. If you have questions about this medicine, talk to your doctor, pharmacist, or health care provider.  2020 Elsevier/Gold Standard (2007-09-24 14:00:02)  Vitamin D Deficiency Vitamin D deficiency is when your body does not have enough vitamin D. Vitamin D is important to your body for many reasons:  It helps the body absorb two important minerals--calcium and phosphorus.  It plays a role in bone health.  It may help to prevent some diseases, such as diabetes and multiple sclerosis.  It plays a role in muscle function, including heart function. If vitamin D deficiency is severe, it can cause a condition in which your bones become soft. In adults, this condition is called osteomalacia. In children, this condition is called rickets. What are the causes? This condition may be caused by:  Not eating enough foods that contain vitamin D.  Not getting enough natural sun exposure.  Having certain digestive system diseases that make it difficult for your body to absorb vitamin D. These diseases include Crohn's disease, chronic pancreatitis, and cystic fibrosis.  Having a surgery in which a part of the stomach or a part of the small intestine is removed.  Having chronic kidney disease or liver disease. What increases the risk? You are more likely to develop this condition if you:  Are older.  Do not spend much time outdoors.  Live in a long-term care facility.  Have had broken bones.  Have weak or thin bones (osteoporosis).  Have a disease or condition that changes  how the body absorbs vitamin D.  Have dark skin.  Take certain medicines, such as steroid medicines or certain seizure medicines.  Are overweight or obese. What are the signs or symptoms? In mild cases of vitamin D deficiency, there may not be any symptoms. If the condition is severe, symptoms may include:  Bone pain.  Muscle pain.  Falling often.  Broken bones caused by a minor injury. How is this diagnosed? This condition may be diagnosed with blood tests. Imaging tests such as X-rays may also be done to look for changes in the bone. How is this treated? Treatment for this condition may depend on what caused the condition. Treatment options include:  Taking vitamin D supplements. Your health care provider will suggest what dose is best for you.  Taking a calcium supplement. Your health care provider will suggest what dose is best for you. Follow these instructions at home: Eating and drinking   Eat foods that contain vitamin D. Choices include: ? Fortified dairy products, cereals, or juices. Fortified means that vitamin D has been added to the food. Check the  label on the package to see if the food is fortified. ? Fatty fish, such as salmon or trout. ? Eggs. ? Oysters. ? Mushrooms. The items listed above may not be a complete list of recommended foods and beverages. Contact a dietitian for more information. General instructions  Take medicines and supplements only as told by your health care provider.  Get regular, safe exposure to natural sunlight.  Do not use a tanning bed.  Maintain a healthy weight. Lose weight if needed.  Keep all follow-up visits as told by your health care provider. This is important. How is this prevented? You can get vitamin D by:  Eating foods that naturally contain vitamin D.  Eating or drinking products that have been fortified with vitamin D, such as cereals, juices, and dairy products (including milk).  Taking a vitamin D  supplement or a multivitamin supplement that contains vitamin D.  Being in the sun. Your body naturally makes vitamin D when your skin is exposed to sunlight. Your body changes the sunlight into a form of the vitamin that it can use. Contact a health care provider if:  Your symptoms do not go away.  You feel nauseous or you vomit.  You have fewer bowel movements than usual or are constipated. Summary  Vitamin D deficiency is when your body does not have enough vitamin D.  Vitamin D is important to your body for good bone health and muscle function, and it may help prevent some diseases.  Vitamin D deficiency is primarily treated through supplementation. Your health care provider will suggest what dose is best for you.  You can get vitamin D by eating foods that contain vitamin D, by being in the sun, and by taking a vitamin D supplement or a multivitamin supplement that contains vitamin D. This information is not intended to replace advice given to you by your health care provider. Make sure you discuss any questions you have with your health care provider. Document Revised: 02/18/2018 Document Reviewed: 02/18/2018 Elsevier Patient Education  2020 Elsevier Inc.   Dysfunctional Uterine Bleeding Dysfunctional uterine bleeding is abnormal bleeding from the uterus. Dysfunctional uterine bleeding includes:  A menstrual period that comes earlier or later than usual.  A menstrual period that is lighter or heavier than usual, or has large blood clots.  Vaginal bleeding between menstrual periods.  Skipping one or more menstrual periods.  Vaginal bleeding after sex.  Vaginal bleeding after menopause. Follow these instructions at home: Eating and drinking   Eat well-balanced meals. Include foods that are high in iron, such as liver, meat, shellfish, green leafy vegetables, and eggs.  To prevent or treat constipation, your health care provider may recommend that you: ? Drink enough  fluid to keep your urine pale yellow. ? Take over-the-counter or prescription medicines. ? Eat foods that are high in fiber, such as beans, whole grains, and fresh fruits and vegetables. ? Limit foods that are high in fat and processed sugars, such as fried or sweet foods. Medicines  Take over-the-counter and prescription medicines only as told by your health care provider.  Do not change medicines without talking with your health care provider.  Aspirin or medicines that contain aspirin may make the bleeding worse. Do not take those medicines: ? During the week before your menstrual period. ? During your menstrual period.  If you were prescribed iron pills, take them as told by your health care provider. Iron pills help to replace iron that your body loses because of  this condition. Activity  If you need to change your sanitary pad or tampon more than one time every 2 hours: ? Lie in bed with your feet raised (elevated). ? Place a cold pack on your lower abdomen. ? Rest as much as possible until the bleeding stops or slows down.  Do not try to lose weight until the bleeding has stopped and your blood iron level is back to normal. General instructions   For two months, write down: ? When your menstrual period starts. ? When your menstrual period ends. ? When any abnormal vaginal bleeding occurs. ? What problems you notice.  Keep all follow up visits as told by your health care provider. This is important. Contact a health care provider if you:  Feel light-headed or weak.  Have nausea and vomiting.  Cannot eat or drink without vomiting.  Feel dizzy or have diarrhea while you are taking medicines.  Are taking birth control pills or hormones, and you want to change them or stop taking them. Get help right away if:  You develop a fever or chills.  You need to change your sanitary pad or tampon more than one time per hour.  Your vaginal bleeding becomes heavier, or your  flow contains clots more often.  You develop pain in your abdomen.  You lose consciousness.  You develop a rash. Summary  Dysfunctional uterine bleeding is abnormal bleeding from the uterus.  It includes menstrual bleeding of abnormal duration, volume, or regularity.  Bleeding after sex and after menopause are also considered dysfunctional uterine bleeding. This information is not intended to replace advice given to you by your health care provider. Make sure you discuss any questions you have with your health care provider. Document Revised: 11/21/2017 Document Reviewed: 11/21/2017 Elsevier Patient Education  2020 [employer].

## 2019-12-15 ENCOUNTER — Telehealth: Payer: Self-pay | Admitting: Obstetrics and Gynecology

## 2019-12-15 DIAGNOSIS — N921 Excessive and frequent menstruation with irregular cycle: Secondary | ICD-10-CM

## 2019-12-15 NOTE — Telephone Encounter (Signed)
-----   Message from Bevelyn Buckles, MD sent at 12/11/2019  9:35 AM EDT ----- Pt still having bad cramping and heavy cyclines and microgestin caused to bleed x 2 weeks x the last 3 cycles so she did not take for June Please call pt for appt to f/u thanksMTS

## 2019-12-15 NOTE — Telephone Encounter (Signed)
Spoke with pt. Started of lomedia 12/20 for menometrorrhagia. Had BTB with OTC by PCP. Had BTB mid cycle a couple months but then progressed to 2 wks bleeding each cycle, changing products QID. Still with dysmen. Given persistence of sx, will check GYN u/s. Pt to call to sched. Will f/u with results.

## 2019-12-19 NOTE — Telephone Encounter (Signed)
Patient is scheduled for 12/30/19 for ultrasound

## 2019-12-24 ENCOUNTER — Other Ambulatory Visit: Payer: Self-pay

## 2019-12-24 ENCOUNTER — Ambulatory Visit (INDEPENDENT_AMBULATORY_CARE_PROVIDER_SITE_OTHER): Payer: PPO

## 2019-12-24 DIAGNOSIS — Z23 Encounter for immunization: Secondary | ICD-10-CM | POA: Diagnosis not present

## 2019-12-24 NOTE — Progress Notes (Signed)
Patient presented for Hep B injection to left deltoid, patient voiced no concerns nor showed any signs of distress during injection. 

## 2019-12-30 ENCOUNTER — Other Ambulatory Visit: Payer: Self-pay

## 2019-12-30 ENCOUNTER — Ambulatory Visit (INDEPENDENT_AMBULATORY_CARE_PROVIDER_SITE_OTHER): Payer: PPO

## 2019-12-30 DIAGNOSIS — N921 Excessive and frequent menstruation with irregular cycle: Secondary | ICD-10-CM

## 2019-12-31 ENCOUNTER — Telehealth: Payer: Self-pay | Admitting: Obstetrics and Gynecology

## 2019-12-31 DIAGNOSIS — N921 Excessive and frequent menstruation with irregular cycle: Secondary | ICD-10-CM

## 2019-12-31 DIAGNOSIS — N83202 Unspecified ovarian cyst, left side: Secondary | ICD-10-CM

## 2019-12-31 MED ORDER — DESOGESTREL-ETHINYL ESTRADIOL 0.15-30 MG-MCG PO TABS: 1.0000 | ORAL_TABLET | Freq: Every day | ORAL | 1 refills | Status: DC

## 2019-12-31 NOTE — Telephone Encounter (Addendum)
Pt aware of GYN u/s results. Neg for menometrorrhagia hx but does have LTO 4.9 cm complex cyst. No LT sided pain. Will recheck u/s in 8 wks.  Has had BTB with lomedia and OTC Lo and still has dysmen. Discussed trying different OCP vs depo. Pt would like depo but her mom is against it. Will try apri OCPs for now. Start today (not sex active, LMP 12/25/19). If sx persist, can rediscuss depo. Pt never sex active. F/u prn.

## 2020-01-15 ENCOUNTER — Other Ambulatory Visit: Payer: Self-pay

## 2020-01-15 ENCOUNTER — Telehealth: Payer: Self-pay | Admitting: Obstetrics and Gynecology

## 2020-01-15 ENCOUNTER — Ambulatory Visit (INDEPENDENT_AMBULATORY_CARE_PROVIDER_SITE_OTHER): Payer: PPO

## 2020-01-15 ENCOUNTER — Ambulatory Visit: Payer: PPO

## 2020-01-15 DIAGNOSIS — N83202 Unspecified ovarian cyst, left side: Secondary | ICD-10-CM

## 2020-01-15 DIAGNOSIS — Z23 Encounter for immunization: Secondary | ICD-10-CM | POA: Diagnosis not present

## 2020-01-15 NOTE — Progress Notes (Signed)
Patient presented today for 2nd Hep B injection to left deltoid, patenit voiced no concern nor showed any signs of distreee during injection.  Shirley Green,cma

## 2020-01-15 NOTE — Telephone Encounter (Signed)
LM with GYN u/s results. Hx of LTO cyst, supposed to have f/u in 8 wks, but done in 2 wks instead. Not sure why. LTO cysts stable in overall size. Rechk in 8 wks. F/u sooner prn.

## 2020-06-14 ENCOUNTER — Other Ambulatory Visit: Payer: Self-pay

## 2020-06-14 ENCOUNTER — Other Ambulatory Visit (INDEPENDENT_AMBULATORY_CARE_PROVIDER_SITE_OTHER): Payer: PPO

## 2020-06-14 DIAGNOSIS — Z1329 Encounter for screening for other suspected endocrine disorder: Secondary | ICD-10-CM

## 2020-06-14 DIAGNOSIS — N938 Other specified abnormal uterine and vaginal bleeding: Secondary | ICD-10-CM

## 2020-06-14 DIAGNOSIS — Z0000 Encounter for general adult medical examination without abnormal findings: Secondary | ICD-10-CM

## 2020-06-14 DIAGNOSIS — E559 Vitamin D deficiency, unspecified: Secondary | ICD-10-CM

## 2020-06-14 DIAGNOSIS — D509 Iron deficiency anemia, unspecified: Secondary | ICD-10-CM | POA: Diagnosis not present

## 2020-06-14 DIAGNOSIS — Z1389 Encounter for screening for other disorder: Secondary | ICD-10-CM

## 2020-06-14 LAB — COMPREHENSIVE METABOLIC PANEL
ALT: 14 U/L (ref 0–35)
AST: 13 U/L (ref 0–37)
Albumin: 4.4 g/dL (ref 3.5–5.2)
Alkaline Phosphatase: 56 U/L (ref 39–117)
BUN: 18 mg/dL (ref 6–23)
CO2: 25 mEq/L (ref 19–32)
Calcium: 9.7 mg/dL (ref 8.4–10.5)
Chloride: 105 mEq/L (ref 96–112)
Creatinine, Ser: 0.85 mg/dL (ref 0.40–1.20)
GFR: 98.91 mL/min (ref >60.00)
Glucose, Bld: 88 mg/dL (ref 70–99)
Potassium: 4.1 mEq/L (ref 3.5–5.1)
Sodium: 139 mEq/L (ref 135–145)
Total Bilirubin: 0.3 mg/dL (ref 0.2–1.2)
Total Protein: 7 g/dL (ref 6.0–8.3)

## 2020-06-14 LAB — CBC WITH DIFFERENTIAL/PLATELET
Basophils Absolute: 0 10*3/uL (ref 0.0–0.1)
Basophils Relative: 0.5 % (ref 0.0–3.0)
Eosinophils Absolute: 0.2 10*3/uL (ref 0.0–0.7)
Eosinophils Relative: 3.1 % (ref 0.0–5.0)
HCT: 35.5 % — ABNORMAL LOW (ref 36.0–46.0)
Hemoglobin: 11.9 g/dL — ABNORMAL LOW (ref 12.0–15.0)
Lymphocytes Relative: 24.5 % (ref 12.0–46.0)
Lymphs Abs: 1.7 10*3/uL (ref 0.7–4.0)
MCHC: 33.7 g/dL (ref 30.0–36.0)
MCV: 90.9 fl (ref 78.0–100.0)
Monocytes Absolute: 0.9 10*3/uL (ref 0.1–1.0)
Monocytes Relative: 12.8 % — ABNORMAL HIGH (ref 3.0–12.0)
Neutro Abs: 4.2 10*3/uL (ref 1.4–7.7)
Neutrophils Relative %: 59.1 % (ref 43.0–77.0)
Platelets: 318 10*3/uL (ref 150.0–400.0)
RBC: 3.9 Mil/uL (ref 3.87–5.11)
RDW: 12.1 % (ref 11.5–14.6)
WBC: 7.1 10*3/uL (ref 4.5–10.5)

## 2020-06-14 LAB — VITAMIN D 25 HYDROXY (VIT D DEFICIENCY, FRACTURES): VITD: 24.3 ng/mL — ABNORMAL LOW (ref 30.00–100.00)

## 2020-06-14 LAB — TSH: TSH: 2.03 u[IU]/mL (ref 0.35–5.50)

## 2020-06-15 LAB — HEPATITIS B SURFACE ANTIBODY, QUANTITATIVE: Hep B S AB Quant (Post): >1000 m[IU]/mL (ref >=10)

## 2020-06-15 LAB — IRON,TIBC AND FERRITIN PANEL
%SAT: 13 % (calc) — ABNORMAL LOW (ref 16–45)
Ferritin: 49 ng/mL (ref 16–154)
Iron: 48 ug/dL (ref 40–190)
TIBC: 379 mcg/dL (calc) (ref 250–450)

## 2020-06-15 LAB — URINALYSIS, ROUTINE W REFLEX MICROSCOPIC
Bilirubin Urine: NEGATIVE
Glucose, UA: NEGATIVE
Hgb urine dipstick: NEGATIVE
Ketones, ur: NEGATIVE
Leukocytes,Ua: NEGATIVE
Nitrite: NEGATIVE
Protein, ur: NEGATIVE
Specific Gravity, Urine: 1.035 (ref 1.001–1.03)
pH: 6 (ref 5.0–8.0)

## 2020-07-05 ENCOUNTER — Telehealth: Payer: Self-pay

## 2020-07-05 NOTE — Telephone Encounter (Signed)
My Chart message sent by mom Torriah in mom's chart regarding patient OCP refill: Good Afternoon Helmut Muster,  Happy New Year to you and your family!! I wanted to make you aware that over the weekend [facility] sent me a text that Zeffie's birth control pills prescription was expiring/needed to be renewed. I selected the option for them to contact your office. She flies back to school this Wednesday. If there's any way we can bypass the pharmacy and it be sent directly through your office, it would be greatly appreciated. [facility] been [facility] lately and I fear that if we wait to go through the pharmacy, she won't have them on time and I will have to mail the pills to her at school. Thank you in advance if this option is possible!!  Lavonia Dana

## 2020-07-06 ENCOUNTER — Other Ambulatory Visit: Payer: Self-pay | Admitting: Obstetrics and Gynecology

## 2020-07-06 DIAGNOSIS — N921 Excessive and frequent menstruation with irregular cycle: Secondary | ICD-10-CM

## 2020-07-06 MED ORDER — DESOGESTREL-ETHINYL ESTRADIOL 0.15-30 MG-MCG PO TABS: 1.0000 | ORAL_TABLET | Freq: Every day | ORAL | 0 refills | Status: DC

## 2020-07-06 NOTE — Telephone Encounter (Signed)
I sent in 3 mo RF. Have PCP manage Rx since she is doing well and is followed by them. She doesn't need to see [place] yearly for Rx if doing well.

## 2020-07-06 NOTE — Progress Notes (Signed)
Rx RF OCPs. PCP needs to manage if she continues AE with them

## 2020-07-06 NOTE — Telephone Encounter (Signed)
Pt aware.

## 2020-09-25 ENCOUNTER — Other Ambulatory Visit: Payer: Self-pay | Admitting: Obstetrics and Gynecology

## 2020-09-25 DIAGNOSIS — N921 Excessive and frequent menstruation with irregular cycle: Secondary | ICD-10-CM

## 2020-12-10 ENCOUNTER — Encounter: Payer: PPO | Admitting: Internal Medicine

## 2021-06-15 ENCOUNTER — Other Ambulatory Visit (HOSPITAL_COMMUNITY)
Admission: RE | Admit: 2021-06-15 | Discharge: 2021-06-15 | Disposition: A | Discharge status: Home / Self Care | Payer: PPO | Source: Ambulatory Visit | Attending: Internal Medicine | Admitting: Internal Medicine

## 2021-06-15 ENCOUNTER — Other Ambulatory Visit: Payer: Self-pay

## 2021-06-15 ENCOUNTER — Encounter: Payer: Self-pay | Admitting: Internal Medicine

## 2021-06-15 ENCOUNTER — Ambulatory Visit (INDEPENDENT_AMBULATORY_CARE_PROVIDER_SITE_OTHER): Payer: PPO | Admitting: Internal Medicine

## 2021-06-15 VITALS — BP 110/82 | HR 77 | Temp 97.0°F | Ht 64.02 in | Wt 135.0 lb

## 2021-06-15 DIAGNOSIS — E559 Vitamin D deficiency, unspecified: Secondary | ICD-10-CM | POA: Diagnosis not present

## 2021-06-15 DIAGNOSIS — Z0000 Encounter for general adult medical examination without abnormal findings: Secondary | ICD-10-CM | POA: Diagnosis not present

## 2021-06-15 DIAGNOSIS — R82998 Other abnormal findings in urine: Secondary | ICD-10-CM

## 2021-06-15 DIAGNOSIS — Z124 Encounter for screening for malignant neoplasm of cervix: Secondary | ICD-10-CM

## 2021-06-15 DIAGNOSIS — N760 Acute vaginitis: Secondary | ICD-10-CM

## 2021-06-15 DIAGNOSIS — Z13220 Encounter for screening for lipoid disorders: Secondary | ICD-10-CM | POA: Diagnosis not present

## 2021-06-15 DIAGNOSIS — Z1329 Encounter for screening for other suspected endocrine disorder: Secondary | ICD-10-CM

## 2021-06-15 DIAGNOSIS — Z0283 Encounter for blood-alcohol and blood-drug test: Secondary | ICD-10-CM

## 2021-06-15 DIAGNOSIS — N83202 Unspecified ovarian cyst, left side: Secondary | ICD-10-CM

## 2021-06-15 DIAGNOSIS — E611 Iron deficiency: Secondary | ICD-10-CM

## 2021-06-15 DIAGNOSIS — Z1389 Encounter for screening for other disorder: Secondary | ICD-10-CM | POA: Diagnosis not present

## 2021-06-15 DIAGNOSIS — R809 Proteinuria, unspecified: Secondary | ICD-10-CM

## 2021-06-15 LAB — URINALYSIS, ROUTINE W REFLEX MICROSCOPIC
Bilirubin Urine: NEGATIVE
Hgb urine dipstick: NEGATIVE
Ketones, ur: NEGATIVE
Leukocytes,Ua: NEGATIVE
Nitrite: POSITIVE — AB
RBC / HPF: NONE SEEN (ref 0–?)
Specific Gravity, Urine: >=1.03 — AB (ref 1.000–1.030)
Total Protein, Urine: NEGATIVE
Urine Glucose: NEGATIVE
Urobilinogen, UA: 0.2 (ref 0.0–1.0)
pH: 6 (ref 5.0–8.0)

## 2021-06-15 LAB — CBC WITH DIFFERENTIAL/PLATELET
Basophils Absolute: 0 10*3/uL (ref 0.0–0.1)
Basophils Relative: 0.6 % (ref 0.0–3.0)
Eosinophils Absolute: 0.1 10*3/uL (ref 0.0–0.7)
Eosinophils Relative: 2.5 % (ref 0.0–5.0)
HCT: 33.8 % — ABNORMAL LOW (ref 36.0–46.0)
Hemoglobin: 11.2 g/dL — ABNORMAL LOW (ref 12.0–15.0)
Lymphocytes Relative: 13.6 % (ref 12.0–46.0)
Lymphs Abs: 0.7 10*3/uL (ref 0.7–4.0)
MCHC: 33 g/dL (ref 30.0–36.0)
MCV: 93.1 fl (ref 78.0–100.0)
Monocytes Absolute: 0.5 10*3/uL (ref 0.1–1.0)
Monocytes Relative: 9.9 % (ref 3.0–12.0)
Neutro Abs: 3.9 10*3/uL (ref 1.4–7.7)
Neutrophils Relative %: 73.4 % (ref 43.0–77.0)
Platelets: 318 10*3/uL (ref 150.0–400.0)
RBC: 3.63 Mil/uL — ABNORMAL LOW (ref 3.87–5.11)
RDW: 12.1 % (ref 11.5–15.5)
WBC: 5.3 10*3/uL (ref 4.0–10.5)

## 2021-06-15 LAB — COMPREHENSIVE METABOLIC PANEL
ALT: 13 U/L (ref 0–35)
AST: 16 U/L (ref 0–37)
Albumin: 4.2 g/dL (ref 3.5–5.2)
Alkaline Phosphatase: 41 U/L (ref 39–117)
BUN: 9 mg/dL (ref 6–23)
CO2: 29 mEq/L (ref 19–32)
Calcium: 9.3 mg/dL (ref 8.4–10.5)
Chloride: 105 mEq/L (ref 96–112)
Creatinine, Ser: 0.72 mg/dL (ref 0.40–1.20)
GFR: 119.86 mL/min (ref >60.00)
Glucose, Bld: 87 mg/dL (ref 70–99)
Potassium: 4.1 mEq/L (ref 3.5–5.1)
Sodium: 141 mEq/L (ref 135–145)
Total Bilirubin: 0.3 mg/dL (ref 0.2–1.2)
Total Protein: 6.6 g/dL (ref 6.0–8.3)

## 2021-06-15 LAB — LIPID PANEL
Cholesterol: 169 mg/dL (ref 0–200)
HDL: 70.9 mg/dL (ref >39.00)
LDL Cholesterol: 87 mg/dL (ref 0–99)
NonHDL: 98.17
Total CHOL/HDL Ratio: 2
Triglycerides: 55 mg/dL (ref 0.0–149.0)
VLDL: 11 mg/dL (ref 0.0–40.0)

## 2021-06-15 LAB — IBC + FERRITIN
Ferritin: 38.8 ng/mL (ref 10.0–291.0)
Iron: 50 ug/dL (ref 42–145)
Saturation Ratios: 12.3 % — ABNORMAL LOW (ref 20.0–50.0)
TIBC: 407.4 ug/dL (ref 250.0–450.0)
Transferrin: 291 mg/dL (ref 212.0–360.0)

## 2021-06-15 LAB — TSH: TSH: 0.84 u[IU]/mL (ref 0.35–5.50)

## 2021-06-15 LAB — VITAMIN D 25 HYDROXY (VIT D DEFICIENCY, FRACTURES): VITD: 22.96 ng/mL — ABNORMAL LOW (ref 30.00–100.00)

## 2021-06-15 MED ORDER — CHOLECALCIFEROL 1.25 MG (50000 UT) PO CAPS: 50000.0000 [IU] | ORAL_CAPSULE | ORAL | 1 refills | Status: AC

## 2021-06-15 NOTE — Addendum Note (Signed)
Addended by: Quentin Ore on: 06/15/2021 05:17 PM   Modules accepted: Orders

## 2021-06-15 NOTE — Patient Instructions (Addendum)
PA-C No Physician Nutritional therapist E-mail Address  [phone] Not available [address]   [place] [address]     Specialties     Obstetrics and Gynecology            Call for appointment    You can try pepcid or prilosec over the counter 30 min before food  Consider GI specialists if upper abdominal still happens   Tdap due 02/15/22 can get here or pharmacy

## 2021-06-15 NOTE — Progress Notes (Signed)
Chief Complaint  Patient presents with   Annual Exam   Gynecologic Exam   Annual  1. Per pt at [facility] c/o elevated creatinine do not have results reviewed on pts phone urine creatinine 352.9 and cut off 300.00 she needed this test repeated     Review of Systems  Constitutional:  Negative for weight loss.  HENT:  Negative for hearing loss.   Eyes:  Negative for blurred vision.  Respiratory:  Negative for shortness of breath.   Cardiovascular:  Negative for chest pain.  Gastrointestinal:  Negative for abdominal pain and blood in stool.  Genitourinary:  Negative for dysuria.  Musculoskeletal:  Negative for falls and joint pain.  Skin:  Negative for rash.  Neurological:  Negative for headaches.  Psychiatric/Behavioral:  Negative for depression.   Past Medical History:  Diagnosis Date   Anemia    Dysmenorrhea    Heavy menses    Past Surgical History:  Procedure Laterality Date   TONSILLECTOMY Bilateral 2007   Family History  Problem Relation Age of Onset   Heart Problems Mother        born TOF with pulmonary valve replaced 09/2016    Thyroid cancer Mother    Other Mother        breast implants 07/2001;small bowel obstruction; DDD C5/6 psinal fusion; C3/6/7 DDD; breast mass with right lumpectomy 04/2016   Neuropathy Mother        pain clinic on topamax    Migraines Mother    Cholecystitis Mother        GB removed   Hernia Mother        hernia repair Dr. Bary Castilla    Colitis Mother        [facility] GI Dr. Candace Cruise   Rheum arthritis Mother        ?   Graves' disease Mother        RAI x 2 04/2010& 01/2011 dx Dr. Cruzita Lederer   Allergies Brother    Hyperlipidemia Maternal Grandmother    Hypertension Maternal Grandmother    Hyperlipidemia Maternal Grandfather    Hypertension Maternal Grandfather    Hyperlipidemia Paternal Grandmother    Hypertension Paternal Grandmother    Hyperlipidemia Paternal Grandfather    Hypertension Paternal Grandfather    Heart disease Paternal Grandfather     Prostate cancer Other        m GF   Lung cancer Other        p GGF   Diabetes Other        pGGM   Cancer Other        mGGF   Stroke Other        mGGF   Pancreatic cancer Other        mGGM    Social History   Socioeconomic History   Marital status: Single    Spouse name: Not on file   Number of children: Not on file   Years of education: Not on file   Highest education level: Not on file  Occupational History   Not on file  Tobacco Use   Smoking status: Never   Smokeless tobacco: Never  Vaping Use   Vaping Use: Never used  Substance and Sexual Activity   Alcohol use: Never   Drug use: Never   Sexual activity: Never    Birth control/protection: Pill    Comment: oral sex only   Other Topics Concern   Not on file  Social History Narrative   [employer] [address] [address]  [place] to travel has been on cruises, been to [place], [place], [place]. 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   No Known Allergies No results found for this or any previous visit (from the past 2160 hour(s)). Objective  Body mass index is 23.16 kg/m. Wt Readings from Last 3 Encounters:  06/15/21 135 lb (61.2 kg)  12/11/19 126 lb 9.6 oz (57.4 kg) (48 %, Z= -0.04)*  06/10/19 132 lb (59.9 kg) (60 %, Z= 0.26)*   * Growth percentiles are based on CDC (Girls, 2-20 Years) data.   Temp Readings from Last 3 Encounters:   06/15/21 (!) 97 F (36.1 C) (Temporal)  12/11/19 97.9 F (36.6 C) (Temporal)   BP Readings from Last 3 Encounters:  06/15/21 110/82  12/11/19 110/70  06/10/19 110/80   Pulse Readings from Last 3 Encounters:  06/15/21 77  12/11/19 67    Physical Exam Vitals and nursing note reviewed.  Constitutional:      Appearance: Normal appearance. She is well-developed and well-groomed.  HENT:     Head: Normocephalic and atraumatic.  Eyes:     Conjunctiva/sclera: Conjunctivae normal.     Pupils: Pupils are equal, round, and reactive to light.  Cardiovascular:     Rate and Rhythm: Normal rate and regular rhythm.     Heart sounds: Normal heart sounds. No murmur heard. Pulmonary:     Effort: Pulmonary effort is normal.     Breath sounds: Normal breath sounds.  Chest:     Chest wall: No mass.  Breasts:    Breasts are symmetrical.     Right: Normal.     Left: Normal.  Abdominal:     General: Abdomen is flat. Bowel sounds are normal.     Tenderness: There is no abdominal tenderness.  Genitourinary:    General: Normal vulva.     Pubic Area: No rash.      Labia:        Right: No tenderness.        Left: No tenderness.      Vagina: Vaginal discharge present.     Cervix: Normal.     Uterus: Normal.      Adnexa: Right adnexa normal and left adnexa normal.  Musculoskeletal:        General: No tenderness.  Lymphadenopathy:     Upper Body:     Right upper body: No axillary adenopathy.     Left upper body: No axillary adenopathy.  Skin:    General: Skin is warm and dry.  Neurological:     General: No focal deficit present.     Mental Status: She is alert and oriented to person, place, and time. Mental status is at baseline.     Cranial Nerves: Cranial nerves 2-12 are intact.     Gait: Gait is intact.  Psychiatric:  Attention and Perception: Attention and perception normal.        Mood and Affect: Mood and affect normal.        Speech: Speech normal.        Behavior:  Behavior normal. Behavior is cooperative.        Thought Content: Thought content normal.        Cognition and Memory: Cognition and memory normal.        Judgment: Judgment normal.    Assessment  Plan  Annual physical exam - Plan: Comprehensive metabolic panel, Lipid panel, CBC w/Diff Iron deficiency - Plan: IBC + Ferritin Lipid screening - Plan: Lipid panel Thyroid disorder screening - Plan: TSH Screening for blood or protein in urine - Plan: Urinalysis, Routine w reflex microscopic Vitamin D deficiency - Plan: Vitamin D (25 hydroxy)  Routine cervical smear - Plan: Cytology - PAP( [facility])  Acute vaginitis - Plan: Cervicovaginal ancillary only( [facility])  Flu shot utd  Tdap due 01/2022  MMR immune  covid shot 3/3 moderna  Hep B immune  Declines std testing   Negative TB 11/2018 rec healthy diet and exercise and responsible Choices rec mvt daily with iron    Vitamin D deficiency rec D3 4000 IU qd   Provider: Dr. Olivia Mackie McLean-Scocuzza-Internal Medicine

## 2021-06-16 ENCOUNTER — Other Ambulatory Visit: Payer: Self-pay | Admitting: Internal Medicine

## 2021-06-16 DIAGNOSIS — B3731 Acute candidiasis of vulva and vagina: Secondary | ICD-10-CM

## 2021-06-16 LAB — CERVICOVAGINAL ANCILLARY ONLY
Bacterial Vaginitis (gardnerella): NEGATIVE
Candida Glabrata: NEGATIVE
Candida Vaginitis: POSITIVE — AB
Comment: NEGATIVE
Comment: NEGATIVE
Comment: NEGATIVE

## 2021-06-16 LAB — CREATININE, URINE, RANDOM: Creatinine, Urine: 434 mg/dL — ABNORMAL HIGH (ref 20–275)

## 2021-06-16 MED ORDER — FLUCONAZOLE 150 MG PO TABS: 150.0000 mg | ORAL_TABLET | Freq: Once | ORAL | 0 refills | Status: AC

## 2021-06-17 ENCOUNTER — Other Ambulatory Visit: Payer: Self-pay

## 2021-06-17 DIAGNOSIS — R8290 Unspecified abnormal findings in urine: Secondary | ICD-10-CM

## 2021-06-20 NOTE — Progress Notes (Signed)
Hi does pt want me to schedule US transvaginal and pelvic to f/u on cyst inform below [facility] message please   Copland, Ilona Sorrel, PA-C  McLean-Scocuzza, Pasty Spillers, MD We don't currently have an ultrasonographer in the office and are having to schedule the ultrasounds "around town". Probably the easiest is for you to go ahead and schedule the ultrasound and then we can follow up If anything needs to be done for her.  Does this work?  Helmut Muster        Previous Messages   ----- Message -----  From: McLean-Scocuzza, Pasty Spillers, MD  Sent: 06/15/2021  11:09 AM EST  To: Ilona Sorrel Copland, PA-C   Pt home from college until 06/29/21 can you all see her for fu now for left ovarian cyst noted [place] 12/2019 ?  If so call pt thanks

## 2021-06-20 NOTE — Progress Notes (Signed)
Also arianna inform pt if urine culture does not show infection I rec she see kidney specialist for protein in urine to see if they are concerned and needs further work up When does she go back to school again?

## 2021-06-21 ENCOUNTER — Other Ambulatory Visit: Payer: PPO

## 2021-06-21 ENCOUNTER — Other Ambulatory Visit: Payer: Self-pay

## 2021-06-21 DIAGNOSIS — R8290 Unspecified abnormal findings in urine: Secondary | ICD-10-CM

## 2021-06-21 LAB — CYTOLOGY - PAP
Comment: NEGATIVE
Diagnosis: NEGATIVE
High risk HPV: NEGATIVE

## 2021-06-23 ENCOUNTER — Encounter: Payer: Self-pay | Admitting: Internal Medicine

## 2021-06-23 ENCOUNTER — Other Ambulatory Visit: Payer: Self-pay | Admitting: Internal Medicine

## 2021-06-23 DIAGNOSIS — N3000 Acute cystitis without hematuria: Secondary | ICD-10-CM

## 2021-06-23 LAB — URINE CULTURE
MICRO NUMBER:: 12799608
SPECIMEN QUALITY:: ADEQUATE

## 2021-06-23 MED ORDER — CIPROFLOXACIN HCL 500 MG PO TABS: 500.0000 mg | ORAL_TABLET | Freq: Two times a day (BID) | ORAL | 0 refills | Status: AC

## 2021-06-23 NOTE — Addendum Note (Signed)
Addended by: Quentin Ore on: 06/23/2021 04:34 PM   Modules accepted: Orders

## 2022-01-30 ENCOUNTER — Telehealth: Payer: Self-pay | Admitting: Internal Medicine

## 2022-01-30 ENCOUNTER — Telehealth: Payer: Self-pay

## 2022-01-30 NOTE — Telephone Encounter (Signed)
Spoke with pt in regards to lab orders for [facility] pick up.

## 2022-01-30 NOTE — Addendum Note (Signed)
Addended by: Quentin Ore on: 01/30/2022 11:17 AM   Modules accepted: Orders

## 2022-01-30 NOTE — Telephone Encounter (Signed)
Pt will need to go to [facility] based on Dr. Shirlee Latch message. she is on our lab schedule for tomorrow. Please call lab appt & notify patient to go to [facility] & fax order to whatever location she plans to go to.

## 2022-01-30 NOTE — Telephone Encounter (Signed)
Order to [facility] pt can go there and get this done any [facility] we may need to fax the order or she can come pick up

## 2022-01-30 NOTE — Telephone Encounter (Signed)
The patient is requesting a urine drug screen for School this week. Can you enter one?

## 2022-01-31 ENCOUNTER — Other Ambulatory Visit: Payer: PPO

## 2022-01-31 LAB — DRUG SCREEN, URINE
Amphetamines, Urine: NEGATIVE ng/mL
Barbiturate screen, urine: NEGATIVE ng/mL
Benzodiazepine Quant, Ur: NEGATIVE ng/mL
Cannabinoid Quant, Ur: NEGATIVE ng/mL
Cocaine (Metab.): NEGATIVE ng/mL
Opiate Quant, Ur: NEGATIVE ng/mL
PCP Quant, Ur: NEGATIVE ng/mL

## 2022-01-31 NOTE — Telephone Encounter (Signed)
Okay, Lab appt has been cancelled.

## 2022-02-01 ENCOUNTER — Ambulatory Visit (INDEPENDENT_AMBULATORY_CARE_PROVIDER_SITE_OTHER): Payer: Self-pay

## 2022-02-01 DIAGNOSIS — Z23 Encounter for immunization: Secondary | ICD-10-CM

## 2022-02-01 NOTE — Progress Notes (Signed)
Pt arrived for Tdap injection, given in L deltoid. 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 Pt tolerated injection well, showed no signs of distress nor voiced any concerns.

## 2022-06-16 ENCOUNTER — Encounter: Payer: PPO | Admitting: Internal Medicine
